# Patient Record
Sex: Female | Born: 1960 | Race: Asian | Hispanic: No | Marital: Single | Smoking: Never smoker
Health system: Southern US, Community
[De-identification: ages and names within clinical notes are randomized; demographics above are authoritative.]

## PROBLEM LIST (undated history)

## (undated) DIAGNOSIS — Z860100 Personal history of colon polyps, unspecified: Secondary | ICD-10-CM

## (undated) DIAGNOSIS — G43909 Migraine, unspecified, not intractable, without status migrainosus: Secondary | ICD-10-CM

## (undated) HISTORY — DX: Personal history of colon polyps, unspecified: Z86.0100

## (undated) HISTORY — DX: Personal history of colonic polyps: Z86.010

## (undated) HISTORY — DX: Migraine, unspecified, not intractable, without status migrainosus: G43.909

---

## 1999-08-08 ENCOUNTER — Other Ambulatory Visit: Admission: RE | Admit: 1999-08-08 | Discharge: 1999-08-08 | Payer: Self-pay | Admitting: Obstetrics and Gynecology

## 2004-11-11 HISTORY — PX: OTHER SURGICAL HISTORY: SHX169

## 2012-04-01 ENCOUNTER — Other Ambulatory Visit (HOSPITAL_BASED_OUTPATIENT_CLINIC_OR_DEPARTMENT_OTHER): Payer: Self-pay | Admitting: Family Medicine

## 2012-04-01 DIAGNOSIS — Z1231 Encounter for screening mammogram for malignant neoplasm of breast: Secondary | ICD-10-CM

## 2012-04-16 ENCOUNTER — Ambulatory Visit (HOSPITAL_BASED_OUTPATIENT_CLINIC_OR_DEPARTMENT_OTHER): Payer: Self-pay

## 2012-04-16 ENCOUNTER — Ambulatory Visit (HOSPITAL_BASED_OUTPATIENT_CLINIC_OR_DEPARTMENT_OTHER)
Admission: RE | Admit: 2012-04-16 | Discharge: 2012-04-16 | Disposition: A | Discharge status: Home / Self Care | Payer: BC Managed Care – PPO | Source: Ambulatory Visit | Attending: Family Medicine | Admitting: Family Medicine

## 2012-04-16 DIAGNOSIS — Z1231 Encounter for screening mammogram for malignant neoplasm of breast: Secondary | ICD-10-CM | POA: Insufficient documentation

## 2013-06-30 ENCOUNTER — Encounter: Payer: Self-pay | Admitting: Family Medicine

## 2013-06-30 ENCOUNTER — Ambulatory Visit (INDEPENDENT_AMBULATORY_CARE_PROVIDER_SITE_OTHER): Payer: BC Managed Care – PPO | Admitting: Family Medicine

## 2013-06-30 VITALS — BP 117/78 | HR 69 | Ht 60.5 in | Wt 130.0 lb

## 2013-06-30 DIAGNOSIS — M545 Low back pain, unspecified: Secondary | ICD-10-CM

## 2013-06-30 LAB — POCT URINALYSIS DIPSTICK
Bilirubin, UA: NEGATIVE
Blood, UA: NEGATIVE
Glucose, UA: NEGATIVE
Ketones, UA: NEGATIVE
Leukocytes, UA: NEGATIVE
Nitrite, UA: NEGATIVE
Protein, UA: NEGATIVE
Spec Grav, UA: <=1.005
Urobilinogen, UA: NEGATIVE
pH, UA: 7.5

## 2013-06-30 MED ORDER — CYCLOBENZAPRINE HCL 5 MG PO TABS: ORAL_TABLET | ORAL | Status: DC

## 2013-06-30 MED ORDER — MELOXICAM 7.5 MG PO TABS: 7.5000 mg | ORAL_TABLET | ORAL | Status: DC

## 2013-06-30 NOTE — Patient Instructions (Addendum)
1)  Back Pain - Take one of the Mobic 7.5 mg in the morning and one of the Flexeril 5 mg after supper for your back pain.  At night I want you to soak in a hot tub with Epsom Salt. Do gentle stretching exercises for your back.  If you don't improve, we can give you a couple of shots.  You may also want to go see a chiropractor for an adjustment.     Back Pain, Adult Low back pain is very common. About 1 in 5 people have back pain.The cause of low back pain is rarely dangerous. The pain often gets better over time.About half of people with a sudden onset of back pain feel better in just 2 weeks. About 8 in 10 people feel better by 6 weeks.  CAUSES Some common causes of back pain include:  Strain of the muscles or ligaments supporting the spine.  Wear and tear (degeneration) of the spinal discs.  Arthritis.  Direct injury to the back. DIAGNOSIS Most of the time, the direct cause of low back pain is not known.However, back pain can be treated effectively even when the exact cause of the pain is unknown.Answering your caregiver's questions about your overall health and symptoms is one of the most accurate ways to make sure the cause of your pain is not dangerous. If your caregiver needs more information, he or she may order lab work or imaging tests (X-rays or MRIs).However, even if imaging tests show changes in your back, this usually does not require surgery. HOME CARE INSTRUCTIONS For many people, back pain returns.Since low back pain is rarely dangerous, it is often a condition that people can learn to [facility] their own.   Remain active. It is stressful on the back to sit or stand in one place. Do not sit, drive, or stand in one place for more than 30 minutes at a time. Take short walks on level surfaces as soon as pain allows.Try to increase the length of time you walk each day.  Do not stay in bed.Resting more than 1 or 2 days can delay your recovery.  Do not avoid exercise or  work.Your body is made to move.It is not dangerous to be active, even though your back may hurt.Your back will likely heal faster if you return to being active before your pain is gone.  Pay attention to your body when you bend and lift. Many people have less discomfortwhen lifting if they bend their knees, keep the load close to their bodies,and avoid twisting. Often, the most comfortable positions are those that put less stress on your recovering back.  Find a comfortable position to sleep. Use a firm mattress and lie on your side with your knees slightly bent. If you lie on your back, put a pillow under your knees.  Only take over-the-counter or prescription medicines as directed by your caregiver. Over-the-counter medicines to reduce pain and inflammation are often the most helpful.Your caregiver may prescribe muscle relaxant drugs.These medicines help dull your pain so you can more quickly return to your normal activities and healthy exercise.  Put ice on the injured area.  Put ice in a plastic bag.  Place a towel between your skin and the bag.  Leave the ice on for 15-20 minutes, 3-4 times a day for the first 2 to 3 days. After that, ice and heat may be alternated to reduce pain and spasms.  Ask your caregiver about trying back exercises and gentle massage.  This may be of some benefit.  Avoid feeling anxious or stressed.Stress increases muscle tension and can worsen back pain.It is important to recognize when you are anxious or stressed and learn ways to manage it.Exercise is a great option. SEEK MEDICAL CARE IF:  You have pain that is not relieved with rest or medicine.  You have pain that does not improve in 1 week.  You have new symptoms.  You are generally not feeling well. SEEK IMMEDIATE MEDICAL CARE IF:   You have pain that radiates from your back into your legs.  You develop new bowel or bladder control problems.  You have unusual weakness or numbness in your  arms or legs.  You develop nausea or vomiting.  You develop abdominal pain.  You feel faint. Document Released: 10/28/2005 Document Revised: 04/28/2012 Document Reviewed: 03/18/2011 [facility] Patient Information 2014 [place], [place].

## 2013-06-30 NOTE — Progress Notes (Signed)
  Subjective:    Patient ID: Sharon Grant, female    DOB: 15-Jan-1961, 52 y.o.   MRN: [id number]  HPI  Sharon Grant is here today complaining of back pain. She has been having this pain for about a month. She describes her pain as being severe in nature and says that it is worsening.  She has had the pain both in her abdomen and back but now it is only in her back.  She did take some Advil which helped very little.    Review of Systems  Constitutional: Positive for fatigue.  HENT: Negative.   Eyes: Negative.   Endocrine: Negative.   Genitourinary: Negative for dysuria.  Musculoskeletal: Positive for back pain.       The pain moves around to the front at times  Psychiatric/Behavioral: Positive for sleep disturbance.       She did not sleep well due to her back pain    Past Medical History  Diagnosis Date  . Migraine   . History of colon polyps     History   Social History Narrative   Marital Status:  Married (Suc)   Children:  Son Theron Arista) Daughter Benetta Spar)    Pets:  Fish   Living Situation: Lives with spouse and children   Occupation: Neurosurgeon   Education: Engineer, agricultural   Tobacco Use/Exposure:  None    Alcohol Use:  None   Drug Use:  None   Diet:  Regular   Exercise:  Walking   Hobbies:  Computers            Objective:   Physical Exam  Constitutional: She appears well-nourished. She appears distressed.  Neck: Normal range of motion. Neck supple.  Cardiovascular: Normal rate, regular rhythm and normal heart sounds.   Pulmonary/Chest: Effort normal and breath sounds normal.  Musculoskeletal: She exhibits tenderness (Low Back Pain). She exhibits no edema.       Lumbar back: She exhibits decreased range of motion, tenderness and spasm. She exhibits no edema and no deformity.  Neurological: She has normal reflexes. She exhibits normal muscle tone. Coordination normal.  Skin: No rash noted.     Assessment & Plan:

## 2013-08-13 ENCOUNTER — Encounter: Payer: Self-pay | Admitting: Family Medicine

## 2013-08-22 DIAGNOSIS — M545 Low back pain, unspecified: Secondary | ICD-10-CM | POA: Insufficient documentation

## 2013-08-22 NOTE — Assessment & Plan Note (Signed)
She was given prescriptions for Mobic and Flexeril.

## 2014-03-30 ENCOUNTER — Other Ambulatory Visit: Payer: Self-pay | Admitting: Family Medicine

## 2014-03-30 ENCOUNTER — Encounter: Payer: Self-pay | Admitting: Family Medicine

## 2014-03-30 ENCOUNTER — Other Ambulatory Visit (HOSPITAL_COMMUNITY)
Admission: RE | Admit: 2014-03-30 | Discharge: 2014-03-30 | Disposition: A | Discharge status: Home / Self Care | Payer: BC Managed Care – PPO | Source: Ambulatory Visit | Attending: Family Medicine | Admitting: Family Medicine

## 2014-03-30 ENCOUNTER — Ambulatory Visit (INDEPENDENT_AMBULATORY_CARE_PROVIDER_SITE_OTHER): Payer: BC Managed Care – PPO | Admitting: Family Medicine

## 2014-03-30 VITALS — BP 118/76 | HR 70 | Resp 16 | Ht 60.25 in | Wt 133.0 lb

## 2014-03-30 DIAGNOSIS — L819 Disorder of pigmentation, unspecified: Secondary | ICD-10-CM

## 2014-03-30 DIAGNOSIS — Z1151 Encounter for screening for human papillomavirus (HPV): Secondary | ICD-10-CM | POA: Insufficient documentation

## 2014-03-30 DIAGNOSIS — Z0000 Encounter for general adult medical examination without abnormal findings: Secondary | ICD-10-CM

## 2014-03-30 DIAGNOSIS — Z01419 Encounter for gynecological examination (general) (routine) without abnormal findings: Secondary | ICD-10-CM | POA: Insufficient documentation

## 2014-03-30 DIAGNOSIS — E559 Vitamin D deficiency, unspecified: Secondary | ICD-10-CM

## 2014-03-30 DIAGNOSIS — Z1231 Encounter for screening mammogram for malignant neoplasm of breast: Secondary | ICD-10-CM

## 2014-03-30 DIAGNOSIS — L811 Chloasma: Secondary | ICD-10-CM

## 2014-03-30 DIAGNOSIS — R42 Dizziness and giddiness: Secondary | ICD-10-CM

## 2014-03-30 DIAGNOSIS — Z124 Encounter for screening for malignant neoplasm of cervix: Secondary | ICD-10-CM

## 2014-03-30 LAB — POCT URINALYSIS DIPSTICK
Bilirubin, UA: NEGATIVE
Blood, UA: NEGATIVE
Glucose, UA: NEGATIVE
Ketones, UA: NEGATIVE
Leukocytes, UA: NEGATIVE
Nitrite, UA: NEGATIVE
Protein, UA: NEGATIVE
Spec Grav, UA: 1.01
Urobilinogen, UA: NEGATIVE
pH, UA: 6.5

## 2014-03-30 LAB — COMPLETE METABOLIC PANEL WITH GFR
ALT: 21 U/L (ref 0–35)
AST: 19 U/L (ref 0–37)
Albumin: 4.6 g/dL (ref 3.5–5.2)
Alkaline Phosphatase: 92 U/L (ref 39–117)
BUN: 12 mg/dL (ref 6–23)
CO2: 27 mEq/L (ref 19–32)
Calcium: 9.3 mg/dL (ref 8.4–10.5)
Chloride: 106 mEq/L (ref 96–112)
Creat: 0.58 mg/dL (ref 0.50–1.10)
GFR, Est African American: >89 mL/min
GFR, Est Non African American: >89 mL/min
Glucose, Bld: 79 mg/dL (ref 70–99)
Potassium: 4.2 mEq/L (ref 3.5–5.3)
Sodium: 141 mEq/L (ref 135–145)
Total Bilirubin: 0.6 mg/dL (ref 0.2–1.2)
Total Protein: 6.9 g/dL (ref 6.0–8.3)

## 2014-03-30 LAB — CBC WITH DIFFERENTIAL/PLATELET
Basophils Absolute: 0 10*3/uL (ref 0.0–0.1)
Basophils Relative: 0 % (ref 0–1)
Eosinophils Absolute: 0.1 10*3/uL (ref 0.0–0.7)
Eosinophils Relative: 3 % (ref 0–5)
HCT: 41.9 % (ref 36.0–46.0)
Hemoglobin: 14.4 g/dL (ref 12.0–15.0)
Lymphocytes Relative: 40 % (ref 12–46)
Lymphs Abs: 1.9 10*3/uL (ref 0.7–4.0)
MCH: 29.6 pg (ref 26.0–34.0)
MCHC: 34.4 g/dL (ref 30.0–36.0)
MCV: 86 fL (ref 78.0–100.0)
Monocytes Absolute: 0.3 10*3/uL (ref 0.1–1.0)
Monocytes Relative: 6 % (ref 3–12)
Neutro Abs: 2.4 10*3/uL (ref 1.7–7.7)
Neutrophils Relative %: 51 % (ref 43–77)
Platelets: 231 10*3/uL (ref 150–400)
RBC: 4.87 MIL/uL (ref 3.87–5.11)
RDW: 13 % (ref 11.5–15.5)
WBC: 4.8 10*3/uL (ref 4.0–10.5)

## 2014-03-30 LAB — TSH: TSH: 1.338 u[IU]/mL (ref 0.350–4.500)

## 2014-03-30 LAB — CHOLESTEROL, TOTAL: Cholesterol: 195 mg/dL (ref 0–200)

## 2014-03-30 LAB — EKG 12-LEAD

## 2014-03-30 MED ORDER — MECLIZINE HCL 25 MG PO TABS: ORAL_TABLET | ORAL | Status: AC

## 2014-03-30 NOTE — Progress Notes (Signed)
Subjective:    Patient ID: Sharon Grant Pham Vita, female    DOB: [birth year], 53 y.o.   MRN: [id number]  HPI  Sharon Grant is here today for her annual CPE/PAP. Overall, she feels that her health is good.  Her only concerns are some dizziness she has been having for the past 2-3 times a month and some dark spots on her face.     Review of Systems  Constitutional: Negative for activity change, appetite change and fatigue.  Cardiovascular: Negative for chest pain, palpitations and leg swelling.  Neurological: Positive for dizziness.  Psychiatric/Behavioral: Negative for behavioral problems and sleep disturbance. The patient is not nervous/anxious.   All other systems reviewed and are negative.     Past Medical History  Diagnosis Date  . Migraine   . History of colon polyps      Past Surgical History  Procedure Laterality Date  . Arthroscopic  knee surgery Left 2006     History   Social History Narrative   Marital Status:  Married (Suc)   Children:  Son Theron Arista(Peter) Daughter Benetta Spar([place])    Pets:  Fish   Living Situation: Lives with spouse and children   Occupation: Neurosurgeoneamstress   Education: Engineer, agriculturalHigh School Graduate   Tobacco Use/Exposure:  None    Alcohol Use:  None   Drug Use:  None   Diet:  Regular   Exercise:  Walking   Hobbies:  Computers            Current Outpatient Prescriptions on File Prior to Visit  Medication Sig Dispense Refill  . Cholecalciferol (VITAMIN D3) 5000 UNITS CAPS Take 1 capsule by mouth daily.      . vitamin E 400 UNIT capsule Take 400 Units by mouth daily.       No current facility-administered medications on file prior to visit.     No Known Allergies      Objective:   Physical Exam  Nursing note and vitals reviewed. Constitutional: She is oriented to person, place, and time. She appears well-developed and well-nourished.  HENT:  Head: Normocephalic and atraumatic.  Right Ear: External ear normal.  Left Ear: External ear normal.  Nose: Nose  normal.  Mouth/Throat: Oropharynx is clear and moist.  Eyes: Conjunctivae and EOM are normal. Pupils are equal, round, and reactive to light.  Neck: Normal range of motion. No thyromegaly present.  Cardiovascular: Normal rate, regular rhythm, normal heart sounds and intact distal pulses.  Exam reveals no gallop and no friction rub.   No murmur heard. Pulmonary/Chest: Effort normal and breath sounds normal. Right breast exhibits no inverted nipple, no mass, no nipple discharge, no skin change and no tenderness. Left breast exhibits no inverted nipple, no mass, no nipple discharge, no skin change and no tenderness. Breasts are symmetrical.  Abdominal: Soft. Bowel sounds are normal. Hernia confirmed negative in the right inguinal area and confirmed negative in the left inguinal area.  Genitourinary: Vagina normal and uterus normal. Pelvic exam was performed with patient supine. There is no rash, tenderness or lesion on the right labia. There is no rash, tenderness or lesion on the left labia. No vaginal discharge found.  Musculoskeletal: Normal range of motion. She exhibits no edema and no tenderness.  Lymphadenopathy:    She has no cervical adenopathy.       Right: No inguinal adenopathy present.       Left: No inguinal adenopathy present.  Neurological: She is alert and oriented to person, place, and time. She has  normal reflexes.  Skin: Skin is warm and dry.     Psychiatric: She has a normal mood and affect. Her behavior is normal. Judgment and thought content normal.       Assessment & Plan:    Sharon Grant was seen today for annual exam.  Diagnoses and associated orders for this visit:  Routine general medical examination at a health care facility - POCT urinalysis dipstick - CBC w/Diff - COMPLETE METABOLIC PANEL WITH GFR - TSH - Cholesterol, Total  Screening for malignant neoplasm of the cervix - Cytology - PAP  Dizziness and giddiness Comments: We checked an EKG which was normal.   She was given a prescription for Antivert.   - EKG 12-Lead - meclizine (ANTIVERT) 25 MG tablet; Take 1/2 -1 tab up to TID for dizziness  Unspecified vitamin D deficiency - Vit D  25 hydroxy (rtn osteoporosis monitoring)  Melasma Comments: She was given Dr. Stefanie LibelStrauss' contact information. She will follow up wiith him for further evaluation.       TIME SPENT "FACE TO FACE" WITH PATIENT -  45 MINS

## 2014-03-30 NOTE — Patient Instructions (Addendum)
1)  Preventative - Get your mammogram and complete the stool cards and return to the office (colon cancer screening). We are checking labs.  You can get signed up on My Chart and see your results.    2)  Skin - It looks like you have a combination of Age Spots and Melasma.  You can see Dr. Stefanie LibelStrauss to confirm.   3)  Dizziness - We checked an EKG which was normal. You might try some Antivert the dizziness.     Dizziness Dizziness is a common problem. It is a feeling of unsteadiness or lightheadedness. You may feel like you are about to faint. Dizziness can lead to injury if you stumble or fall. A person of any age group can suffer from dizziness, but dizziness is more common in older adults. CAUSES  Dizziness can be caused by many different things, including:  Middle ear problems.  Standing for too long.  Infections.  An allergic reaction.  Aging.  An emotional response to something, such as the sight of blood.  Side effects of medicines.  Fatigue.  Problems with circulation or blood pressure.  Excess use of alcohol, medicines, or illegal drug use.  Breathing too fast (hyperventilation).  An arrhythmia or problems with your heart rhythm.  Low red blood cell count (anemia).  Pregnancy.  Vomiting, diarrhea, fever, or other illnesses that cause dehydration.  Diseases or conditions such as Parkinson's disease, high blood pressure (hypertension), diabetes, and thyroid problems.  Exposure to extreme heat. DIAGNOSIS  To find the cause of your dizziness, your caregiver may do a physical exam, lab tests, radiologic imaging scans, or an electrocardiography test (ECG).  TREATMENT  Treatment of dizziness depends on the cause of your symptoms and can vary greatly. HOME CARE INSTRUCTIONS   Drink enough fluids to keep your urine clear or pale yellow. This is especially important in very hot weather. In the elderly, it is also important in cold weather.  If your dizziness is caused  by medicines, take them exactly as directed. When taking blood pressure medicines, it is especially important to get up slowly.  Rise slowly from chairs and steady yourself until you feel okay.  In the morning, first sit up on the side of the bed. When this seems okay, stand slowly while holding onto something until you know your balance is fine.  If you need to stand in one place for a long time, be sure to move your legs often. Tighten and relax the muscles in your legs while standing.  If dizziness continues to be a problem, have someone stay with you for a day or two. Do this until you feel you are well enough to stay alone. Have the person call your caregiver if he or she notices changes in you that are concerning.  Do not drive or use heavy machinery if you feel dizzy.  Do not drink alcohol. SEEK IMMEDIATE MEDICAL CARE IF:   Your dizziness or lightheadedness gets worse.  You feel nauseous or vomit.  You develop problems with talking, walking, weakness, or using your arms, hands, or legs.  You are not thinking clearly or you have difficulty forming sentences. It may take a friend or family member to determine if your thinking is normal.  You develop chest pain, abdominal pain, shortness of breath, or sweating.  Your vision changes.  You notice any bleeding.  You have side effects from medicine that seems to be getting worse rather than better. MAKE SURE YOU:  Understand these instructions.  Will watch your condition.  Will get help right away if you are not doing well or get worse. Document Released: 04/23/2001 Document Revised: 01/20/2012 Document Reviewed: 05/17/2011 [facility] Patient Information 2014 [place], [place].  Guaiac Test A guaiac test checks for blood in the poop (bowel movement). BEFORE THE TEST  Avoid alcohol and iron pills.  Only take medicine as told by your doctor.  Ask your doctor about stopping or changing medicines.  Avoid Vitamin C pills or  medicines.  Avoid antiseptic solution that has iodine in it.  Avoid citrus fruits and juices. TEST  Put the sample cards and sticks in the bathroom so they will be ready when you have to poop.  Collect a poop sample with the stick.  Smear a small amount of poop on the card.  Take another sample from a different part of the poop.  Store the cards with the samples in a plastic bag.  Throw the rest of the poop into the toilet and flush.  Wash your hands well. AFTER THE TEST  Bring the cards in a plastic bag to your clinic.  Do not wait more than 3 days to take the card to your clinic. Document Released: 08/06/2008 Document Revised: 01/20/2012 Document Reviewed: 08/06/2008 [facility] Patient Information 2014 [place], [place].

## 2014-03-31 LAB — VITAMIN D 25 HYDROXY (VIT D DEFICIENCY, FRACTURES): Vit D, 25-Hydroxy: 40 ng/mL (ref 30–89)

## 2014-04-06 ENCOUNTER — Ambulatory Visit (HOSPITAL_BASED_OUTPATIENT_CLINIC_OR_DEPARTMENT_OTHER)
Admission: RE | Admit: 2014-04-06 | Discharge: 2014-04-06 | Disposition: A | Discharge status: Home / Self Care | Payer: BC Managed Care – PPO | Source: Ambulatory Visit | Attending: Family Medicine | Admitting: Family Medicine

## 2014-04-06 DIAGNOSIS — Z1231 Encounter for screening mammogram for malignant neoplasm of breast: Secondary | ICD-10-CM | POA: Insufficient documentation

## 2014-04-08 ENCOUNTER — Other Ambulatory Visit (INDEPENDENT_AMBULATORY_CARE_PROVIDER_SITE_OTHER): Payer: BC Managed Care – PPO | Admitting: *Deleted

## 2014-04-08 DIAGNOSIS — Z0000 Encounter for general adult medical examination without abnormal findings: Secondary | ICD-10-CM

## 2014-04-08 DIAGNOSIS — Z1210 Encounter for screening for malignant neoplasm of intestinal tract, unspecified: Secondary | ICD-10-CM

## 2014-04-08 LAB — HEMOCCULT GUIAC POC 1CARD (OFFICE)
Card #2 Fecal Occult Blod, POC: NEGATIVE
Card #3 Fecal Occult Blood, POC: NEGATIVE
Fecal Occult Blood, POC: NEGATIVE

## 2014-06-23 DIAGNOSIS — R42 Dizziness and giddiness: Secondary | ICD-10-CM | POA: Insufficient documentation

## 2014-06-23 DIAGNOSIS — E559 Vitamin D deficiency, unspecified: Secondary | ICD-10-CM | POA: Insufficient documentation

## 2014-06-23 DIAGNOSIS — L811 Chloasma: Secondary | ICD-10-CM | POA: Insufficient documentation

## 2015-12-29 IMAGING — MG MM DIGITAL SCREENING
5 series · 5 of 5 positions shown · non-contrast
Comparison: Previous exam(s).

CLINICAL DATA: Screening.

EXAM:
DIGITAL SCREENING BILATERAL MAMMOGRAM WITH CAD

[R CC (1 of 2)]
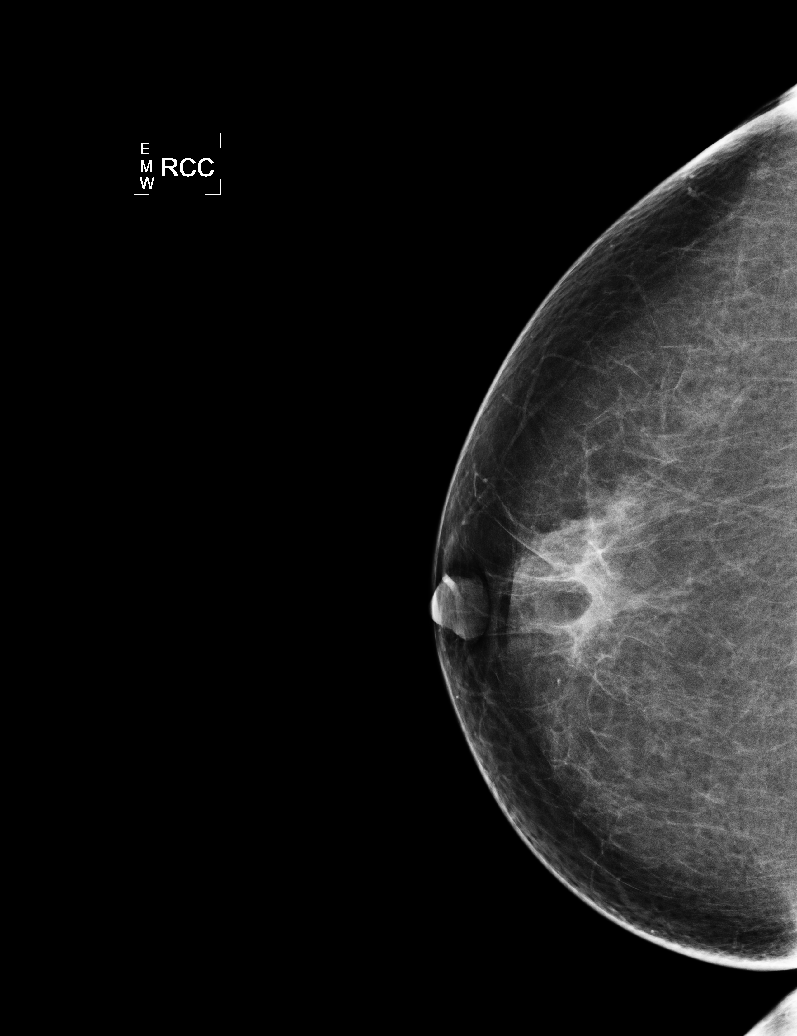

[L CC]
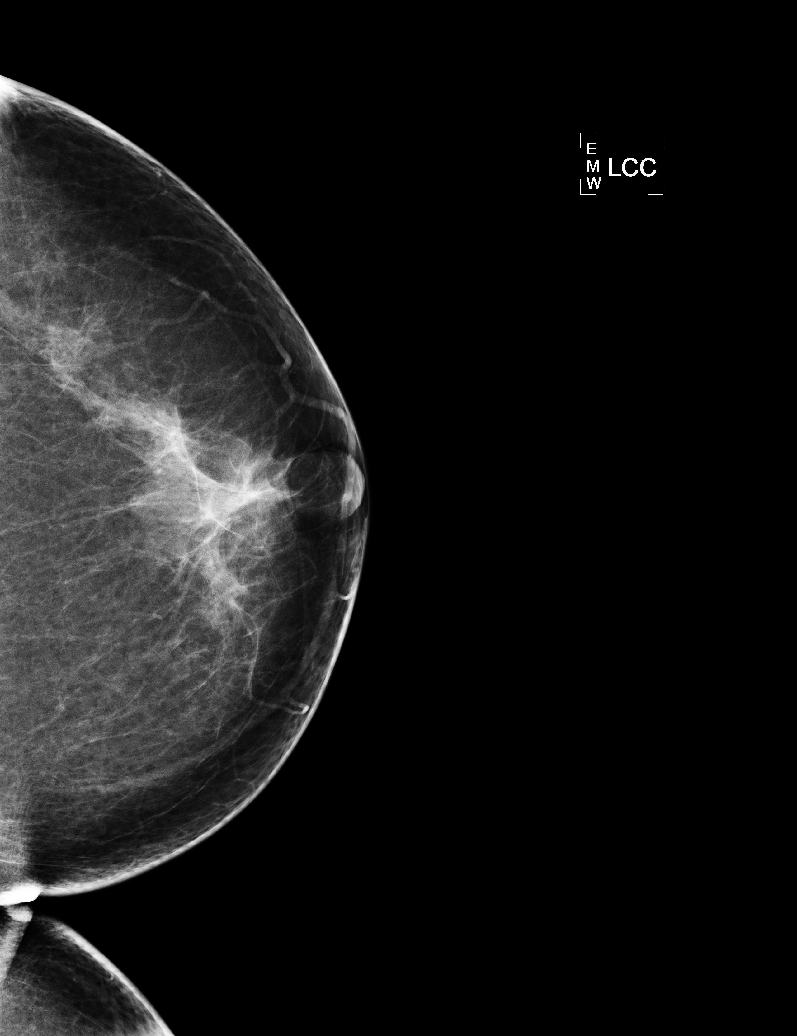

[L MLO]
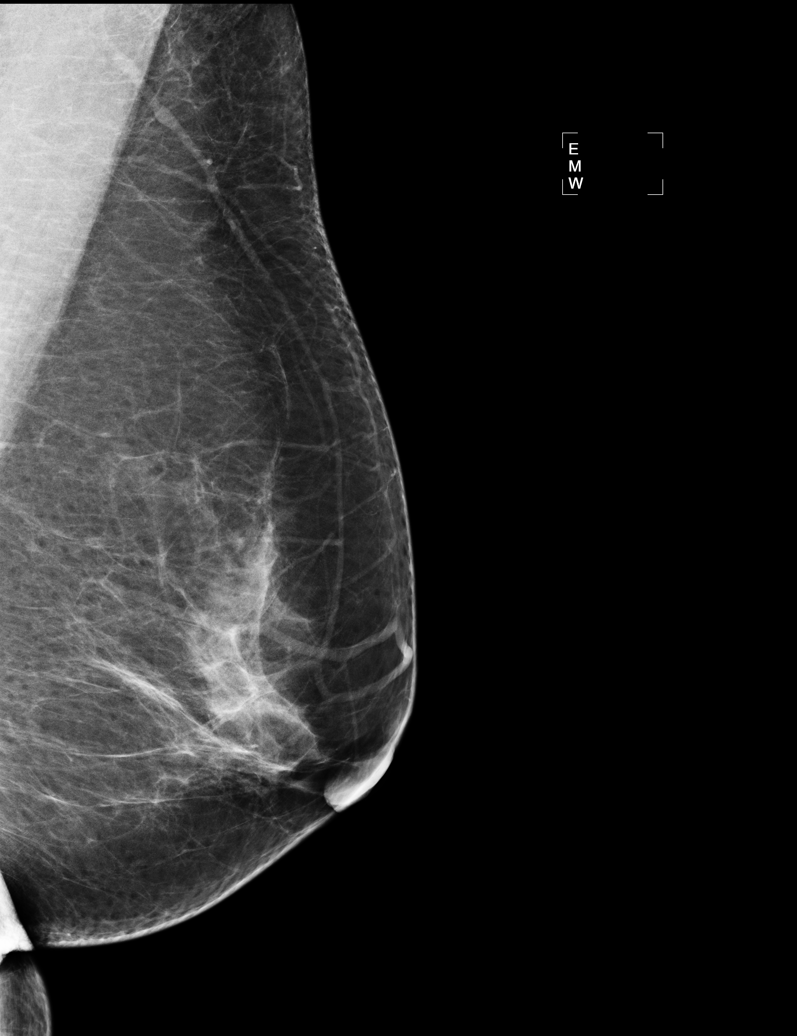

[R MLO]
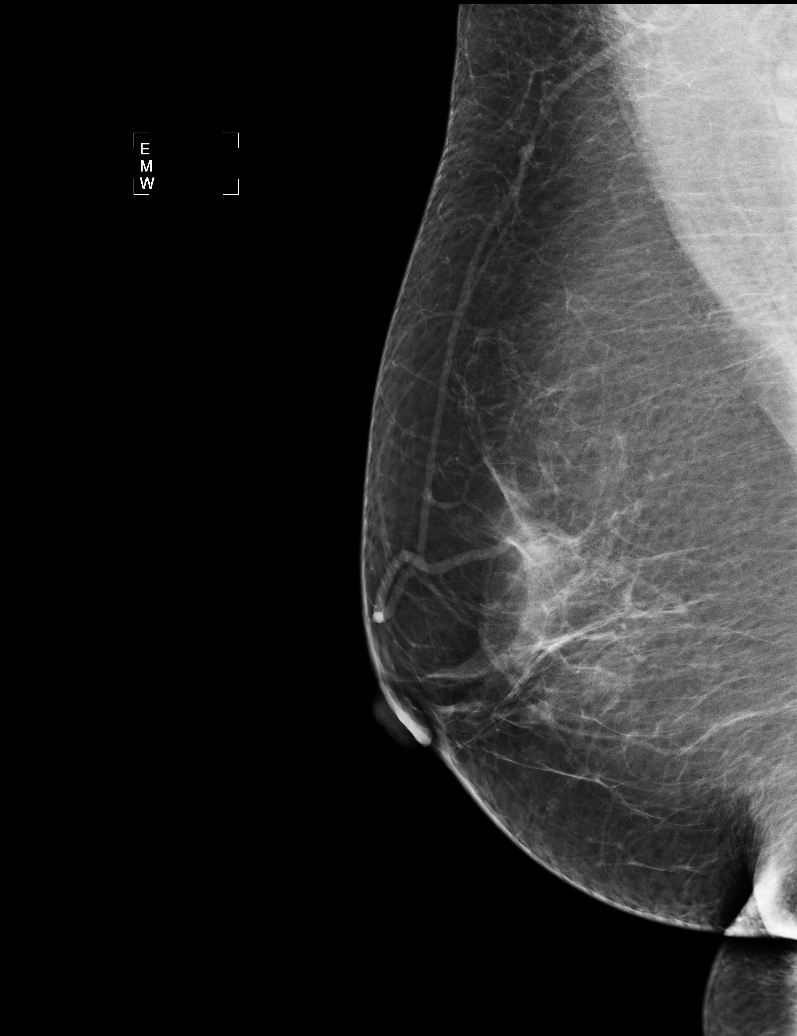

[R CC (2 of 2)]
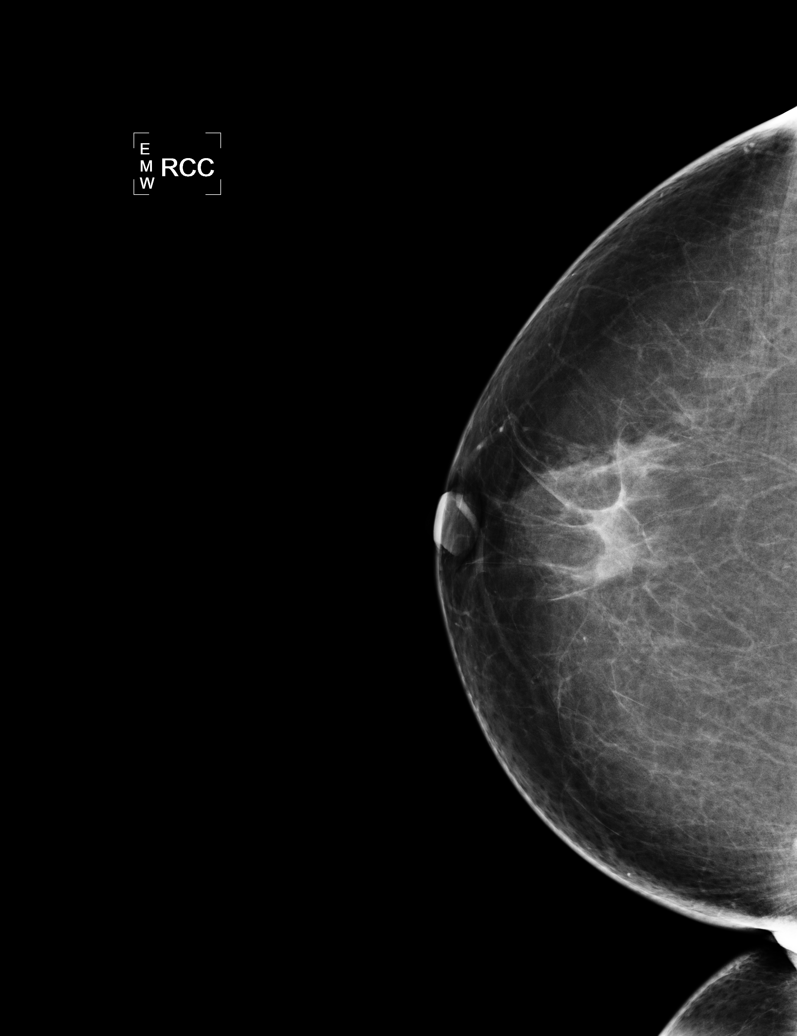

[5 of 5 positions shown; findings below may reference images not displayed]

ACR Breast Density Category b: There are scattered areas of
fibroglandular density.
FINDINGS: There are no findings suspicious for malignancy. Images were
processed with CAD.
IMPRESSION: No mammographic evidence of malignancy. A result letter of this
screening mammogram will be mailed directly to the patient.

RECOMMENDATION:
Screening mammogram in one year. (Code:AS-G-LCT)

BI-RADS CATEGORY  1: Negative.

## 2020-02-14 ENCOUNTER — Ambulatory Visit: Payer: Self-pay
